# Patient Record
Sex: Male | Born: 1955 | Race: White | Hispanic: No | Marital: Single | Smoking: Never smoker
Health system: Southern US, Academic
[De-identification: ages and names within clinical notes are randomized; demographics above are authoritative.]

## PROBLEM LIST (undated history)

## (undated) DIAGNOSIS — E119 Type 2 diabetes mellitus without complications: Secondary | ICD-10-CM

## (undated) DIAGNOSIS — I10 Essential (primary) hypertension: Secondary | ICD-10-CM

## (undated) HISTORY — PX: FINGER AMPUTATION: SHX636

## (undated) HISTORY — PX: HX HERNIA REPAIR: SHX51

## (undated) HISTORY — DX: Essential (primary) hypertension: I10

## (undated) HISTORY — DX: Type 2 diabetes mellitus without complications (CMS HCC): E11.9

## (undated) HISTORY — PX: HX TONSILLECTOMY: SHX27

## (undated) HISTORY — PX: KNEE SURGERY: SHX244

---

## 2020-05-07 IMAGING — MR MRI THORACIC SPINE WITHOUT CONTRAST
4 of 6 series · 19 of 48 positions shown · IV contrast (gadolinium)
Comparison: Radiographs from outside facility dated 04/16/2020.

EXAM:  MRI THORACIC SPINE WITHOUT CONTRAST
INDICATION: Chronic mid upper back pain.
TECHNIQUE: Multiplanar multisequential MRI of the thoracic spine was performed without gadolinium contrast. A non-conventional body coil was utilized due to patient's extremely large body habitus.

[Series 10: T2 · sagittal · 5.0mm · 0.78mm/px · 8 of 11 slices shown (1 of 2)]
[im 1/11]
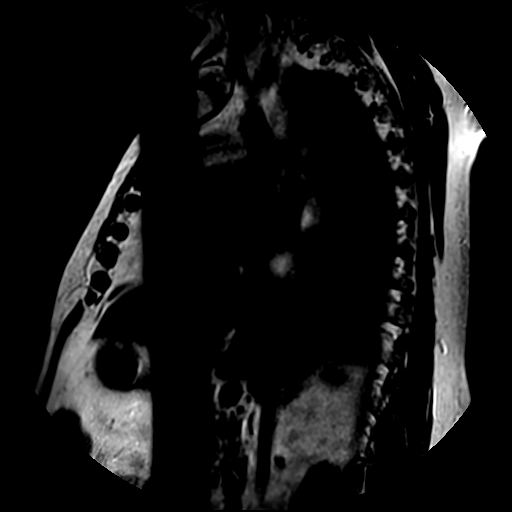
[im 2/11]
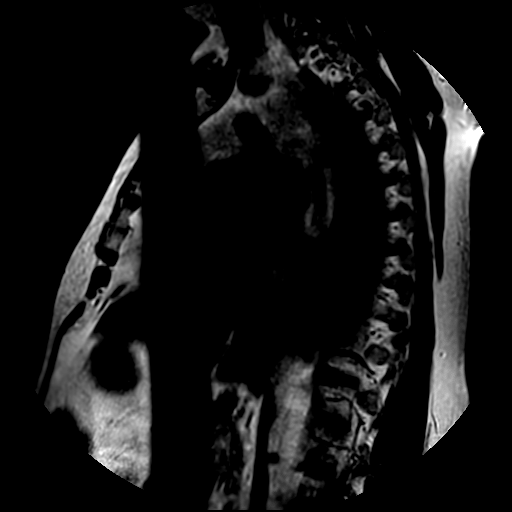
[im 3/11]
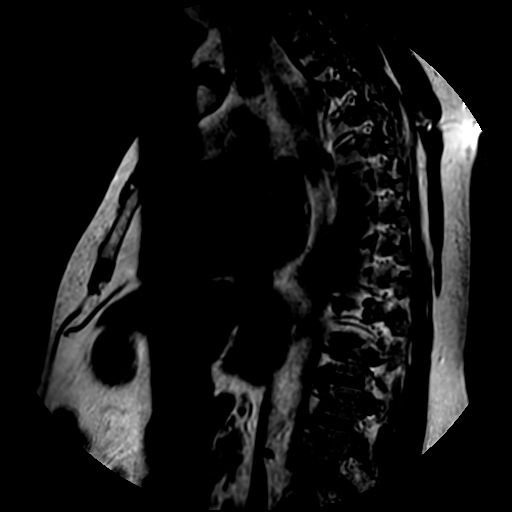
[im 5/11]
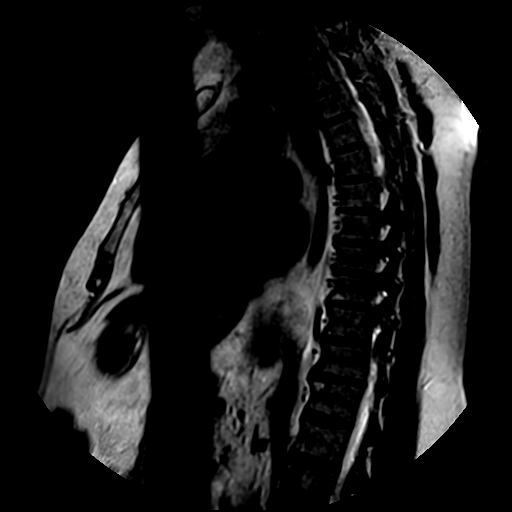
[im 6/11]
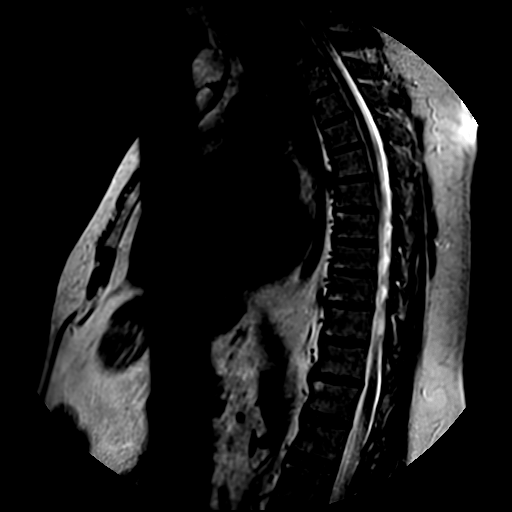
[im 8/11]
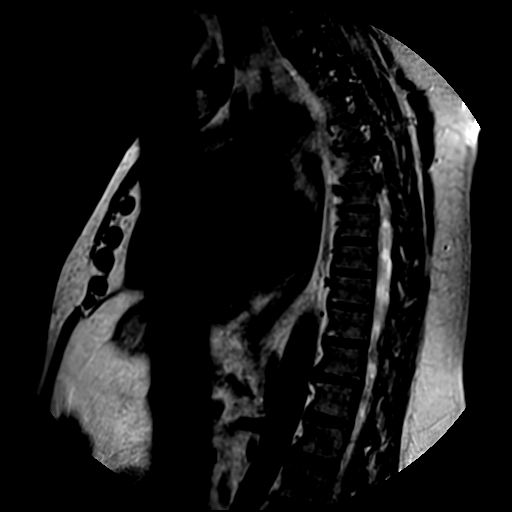
[im 9/11]
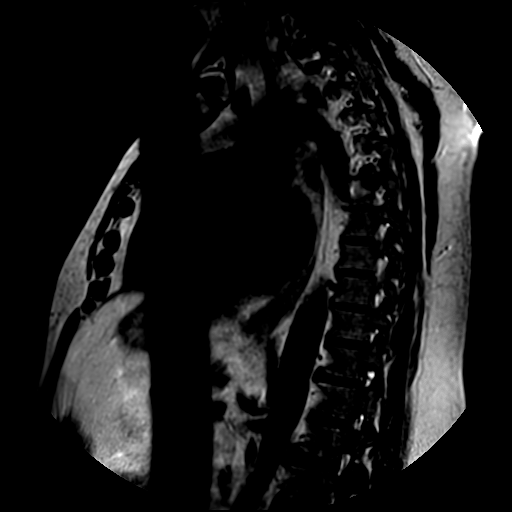
[im 11/11]
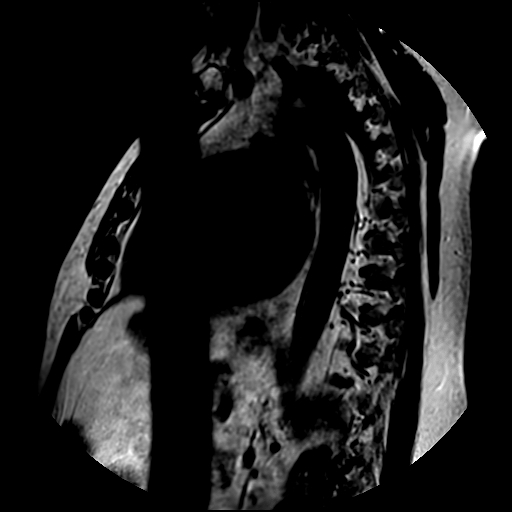

[Series 12: T1 · sagittal · 5.0mm · 0.78mm/px · 3 of 11 slices shown (1 of 2)]
[im 2/11]
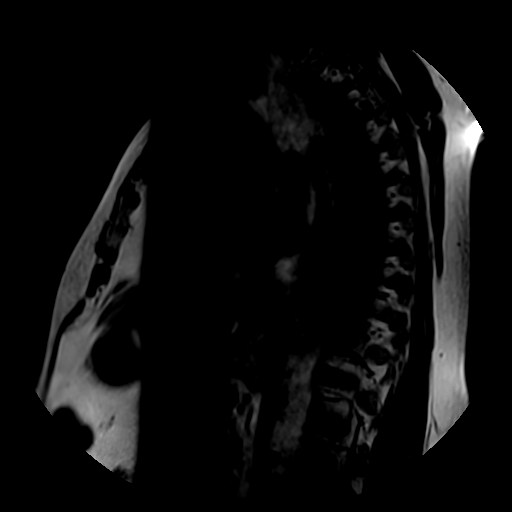
[im 6/11]
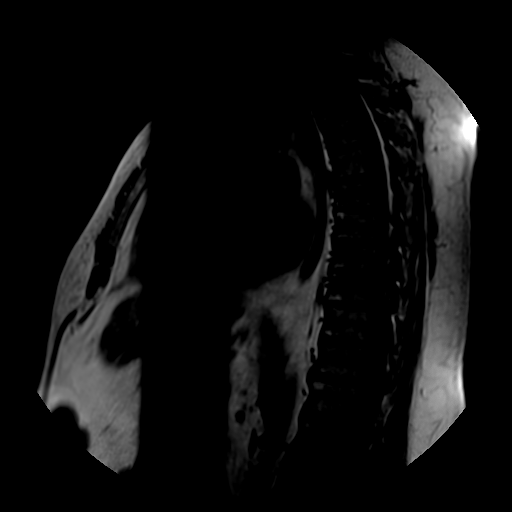
[im 9/11]
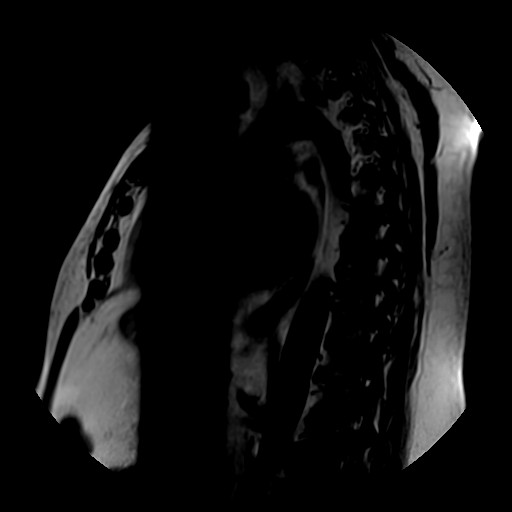

[Series 14: T2 · axial · 5.0mm · 0.45mm/px · z∈[-107,+76]mm · 5 of 12 slices shown (2 of 2)]
[im 1/12]
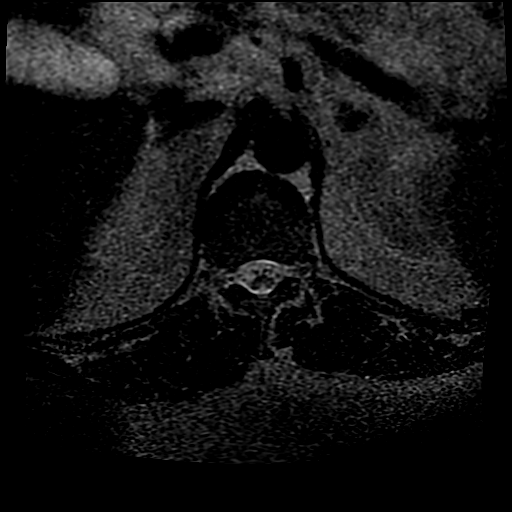
[im 2/12]
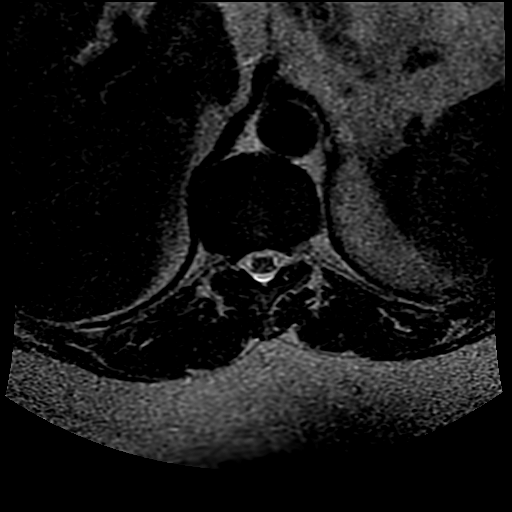
[im 3/12]
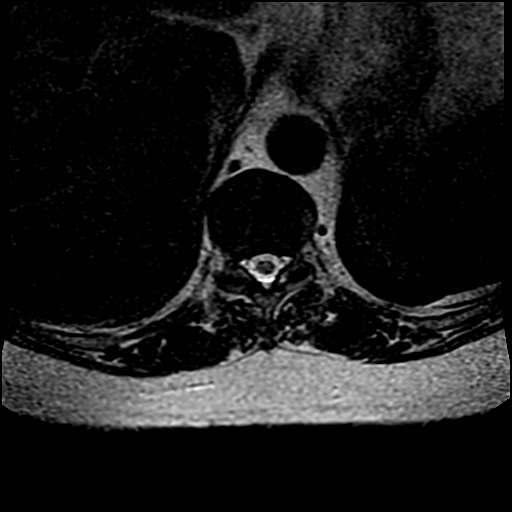
[im 6/12]
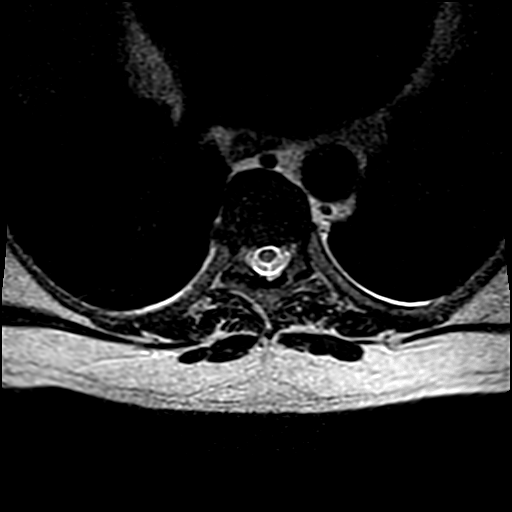
[im 10/12]
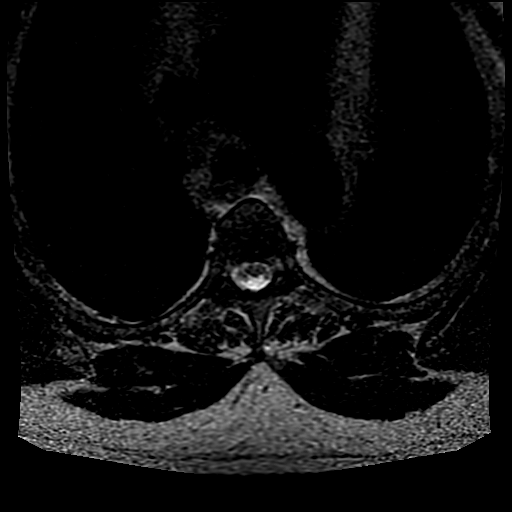

[Series 15: T1 · axial · 5.0mm · 0.45mm/px · z∈[-81,+76]mm · 3 of 12 slices shown (2 of 2)]
[im 2/12]
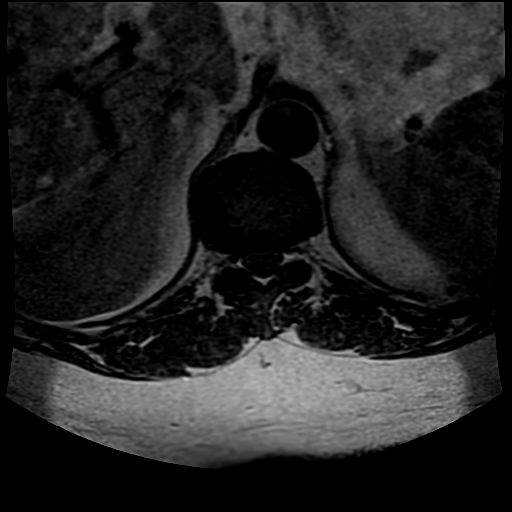
[im 6/12]
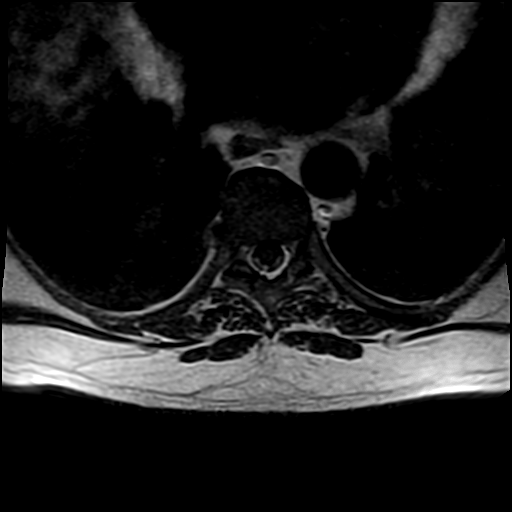
[im 10/12]
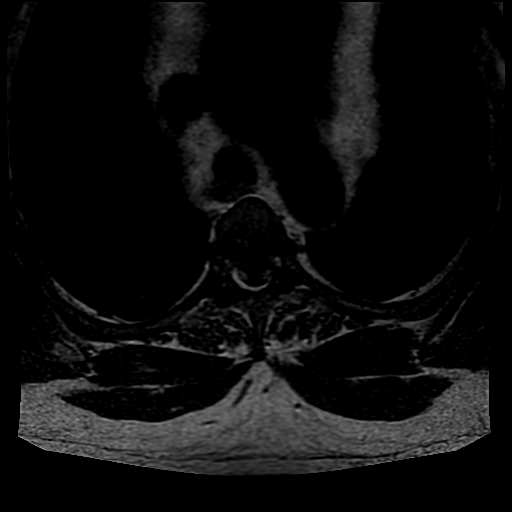

[19 of 48 positions shown; findings below may reference images not displayed]

FINDINGS: Vertebral bodies are normal in height, alignment and signal intensity. There is no acute fracture or subluxation. Visualized spinal cord is also normal in signal intensity without evidence of compression at any level.

There is no significant disc herniation, spinal canal or neural foraminal stenosis at any level.

Paraspinal soft tissues are also unremarkable. There is no pleural effusion.
IMPRESSION: Essentially unremarkable exam.

## 2020-05-07 IMAGING — MR MRI CERVICAL SPINE WITHOUT CONTRAST
4 of 5 series · 23 of 48 positions shown · IV contrast (gadolinium)
Comparison: Radiographs from outside facility dated 04/16/2020.

EXAM:  MRI CERVICAL SPINE WITHOUT CONTRAST
INDICATION: Chronic neck pain.
TECHNIQUE: Multiplanar multisequential MRI of the cervical spine was performed without gadolinium contrast.

[Series 8: T2 · sagittal · 3.0mm · 0.75mm/px · 8 of 13 slices shown (1 of 2)]
[im 1/13]
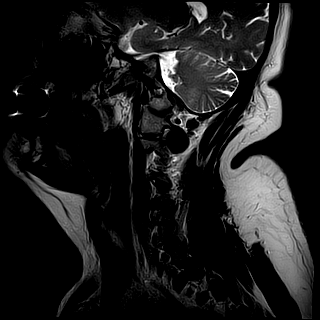
[im 2/13]
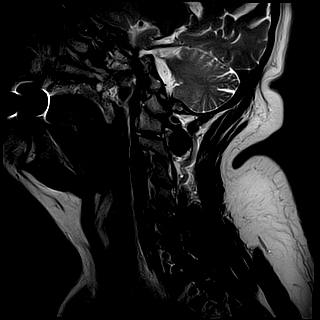
[im 4/13]
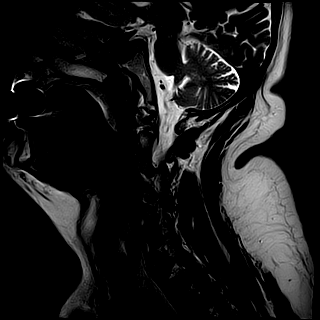
[im 6/13]
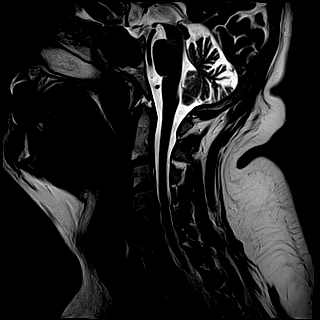
[im 7/13]
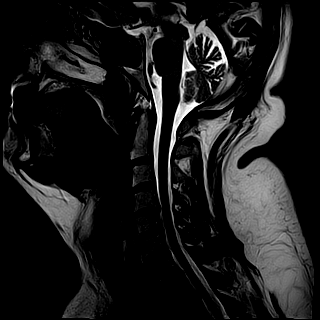
[im 9/13]
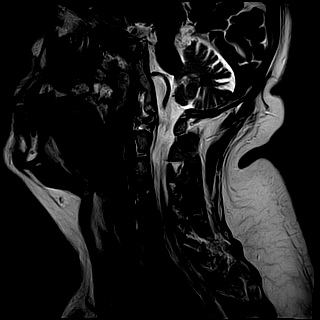
[im 11/13]
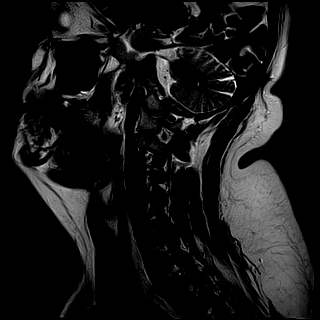
[im 13/13]
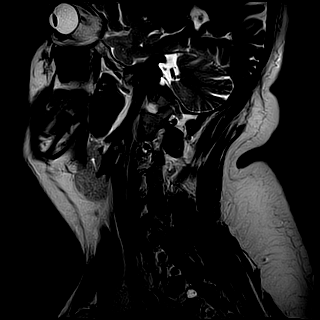

[Series 9: T1 · sagittal · 3.0mm · 0.47mm/px · 3 of 13 slices shown]
[im 2/13]
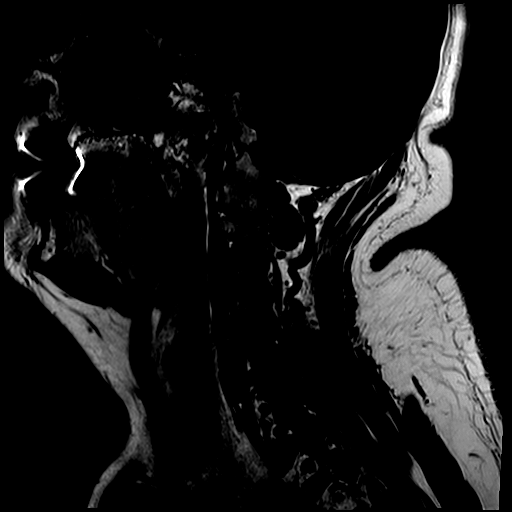
[im 7/13]
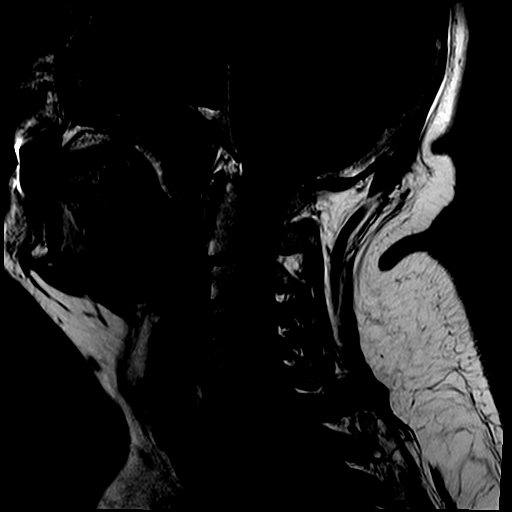
[im 11/13]
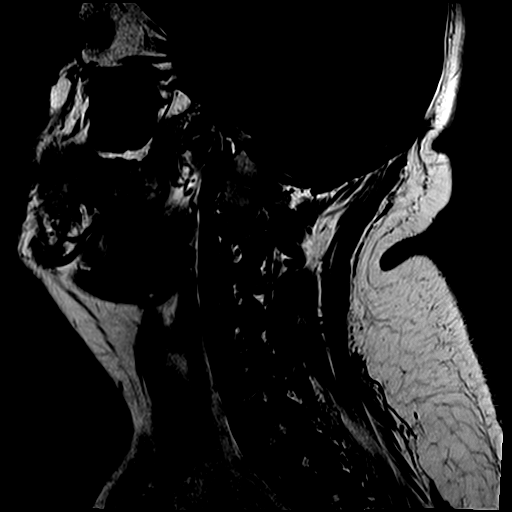

[Series 11: STIR · sagittal · 3.0mm · 0.47mm/px · 3 of 13 slices shown]
[im 2/13]
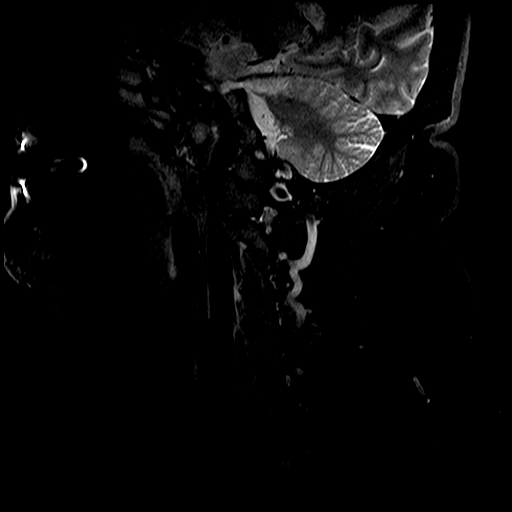
[im 7/13]
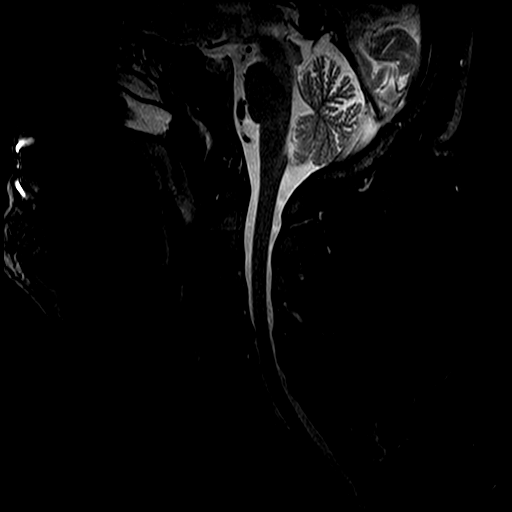
[im 11/13]
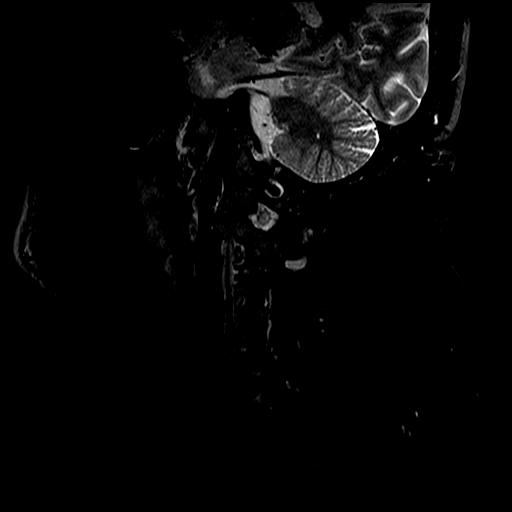

[Series 14: T2 · axial · 3.0mm · 0.31mm/px · z∈[-127,-7]mm · 9 of 18 slices shown (2 of 2)]
[im 1/18]
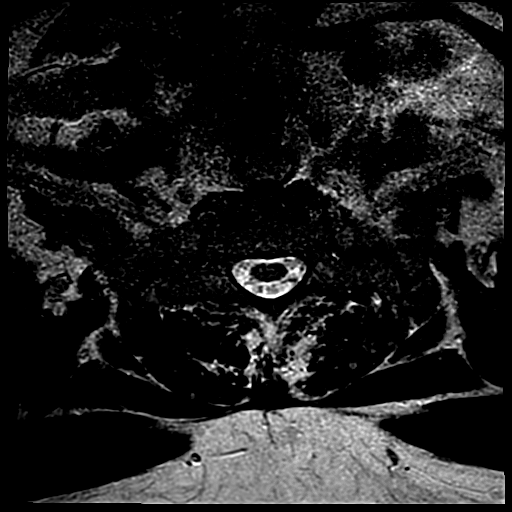
[im 4/18]
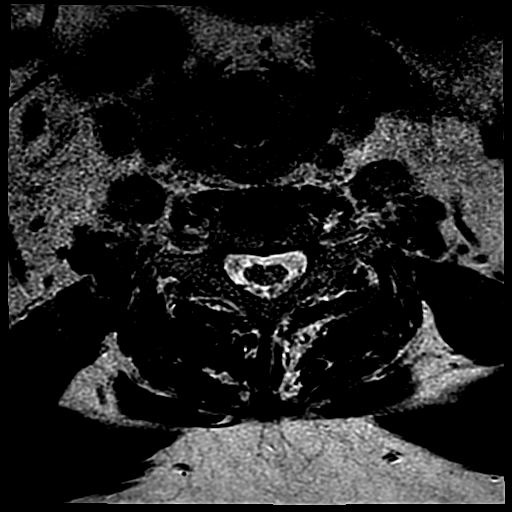
[im 5/18]
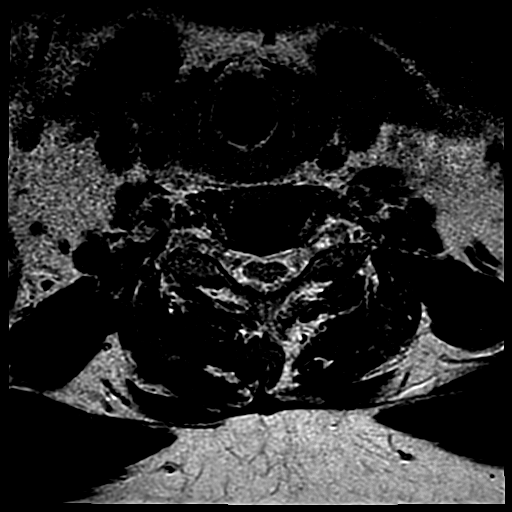
[im 8/18]
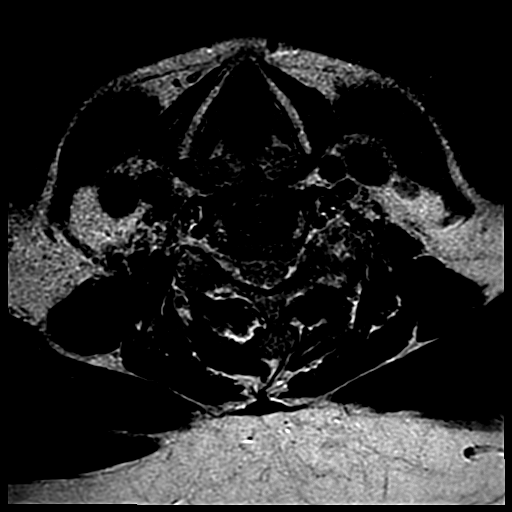
[im 10/18]
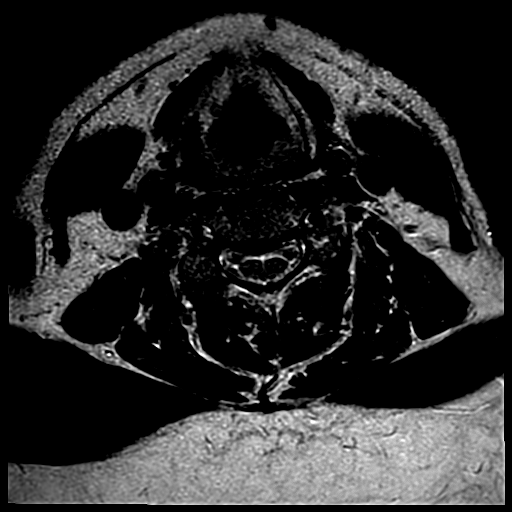
[im 13/18]
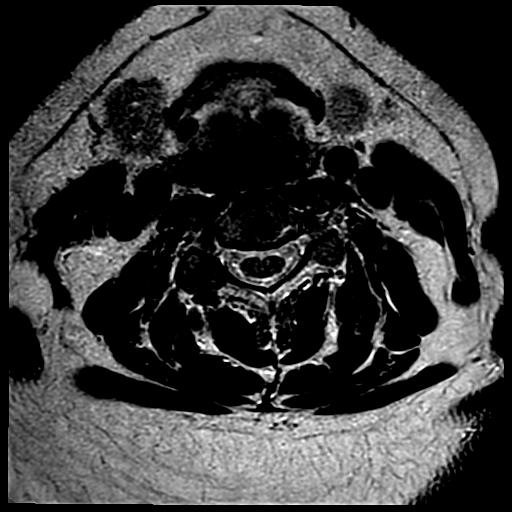
[im 14/18]
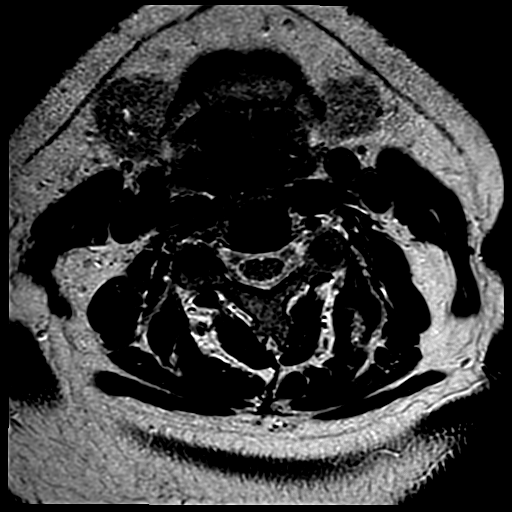
[im 16/18]
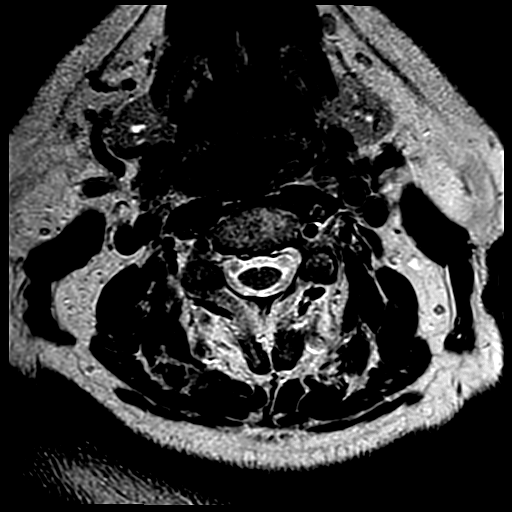
[im 18/18]
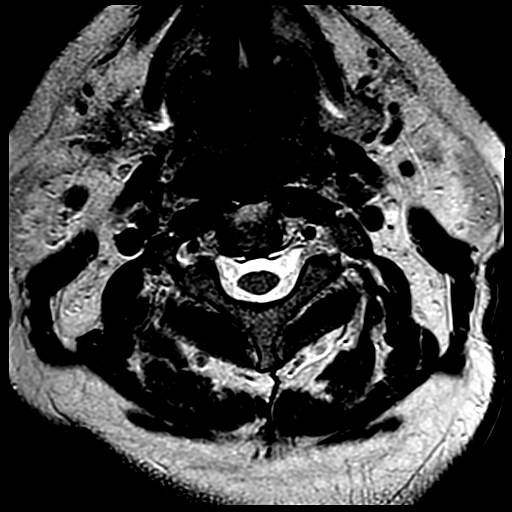

[23 of 48 positions shown; findings below may reference images not displayed]

FINDINGS: Vertebral bodies are normal in height, alignment and signal intensity. There is no acute fracture or subluxation. Visualized spinal cord is normal in signal intensity without evidence of compression at any level.

C2-3 and C3-4 levels are unremarkable.

At C4-5 level, there is a small broad-based central disc osteophyte complex with near complete effacement of the ventral CSF. There is no significant neural foraminal stenosis.

At C5-6 level, there is a minimal bulging annulus, minimally effacing the ventral CSF. There is moderate to severe bilateral neural foraminal stenosis from facet and uncovertebral joint hypertrophy.

At C6-7 level, there is a minimal bulging annulus, minimally effacing the ventral CSF. There is no significant neural foraminal stenosis.

At C7-T1 level, there is a small broad-based central disc bulge, mildly effacing the ventral CSF. There is no significant neural foraminal stenosis.

Paraspinal soft tissues are unremarkable.
IMPRESSION: Near complete effacement of the ventral CSF at C4-5 level from a small central disc osteophyte complex. 

Moderate to severe bilateral neural foraminal stenosis at C5-6 level from facet and uncovertebral joint hypertrophy.

## 2020-05-31 ENCOUNTER — Ambulatory Visit: Payer: 59 | Attending: Physician Assistant | Admitting: Neurological Surgery

## 2020-05-31 ENCOUNTER — Other Ambulatory Visit: Payer: Self-pay

## 2020-05-31 ENCOUNTER — Encounter (INDEPENDENT_AMBULATORY_CARE_PROVIDER_SITE_OTHER): Payer: Self-pay | Admitting: Neurological Surgery

## 2020-05-31 VITALS — BP 148/116 | HR 92 | Temp 96.7°F | Ht 71.0 in | Wt 290.3 lb

## 2020-05-31 DIAGNOSIS — G5600 Carpal tunnel syndrome, unspecified upper limb: Secondary | ICD-10-CM

## 2020-05-31 DIAGNOSIS — M79603 Pain in arm, unspecified: Secondary | ICD-10-CM | POA: Insufficient documentation

## 2020-05-31 DIAGNOSIS — M4800 Spinal stenosis, site unspecified: Secondary | ICD-10-CM | POA: Insufficient documentation

## 2020-05-31 DIAGNOSIS — M4802 Spinal stenosis, cervical region: Secondary | ICD-10-CM

## 2020-05-31 NOTE — Progress Notes (Addendum)
NEUROSURGERY, PHYSICIAN OFFICE CENTER  [address]  [place] [address]  Operated by [facility]  History and Physical     Name: Andrew Haney MRN:  [id number]   Date: 05/31/2020 Age: 64 y.o.       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 His pain is in the right posterior arm into the anterior forearm and 4th and 5th digits  He has pain in the bilateral shoulders. He has numbness and weakness in the left arm but the right arm has been more problematic. He denies balance issues or bb control.  His pain increases with lying supine, sitting and leaning back in a chair, or when his arms are above his head. His pain improves when up and active.  He denies PT. He was seen by chiropractor for hip only. He denies spine surgery, spine fractures or injections.  He takes 4 aleve a day with minimal help. Biofreeze doesn't help.  Past History  Current Outpatient Medications   Medication Sig    Amlodipine-Valsartan 10-320 mg Oral Tablet Take 1 Tablet by mouth Once a day     gemfibroziL (LOPID) 600 mg Oral Tablet Take 600 mg by mouth Twice a day before meals    hydroCHLOROthiazide (HYDRODIURIL) 25 mg Oral Tablet Take 25 mg by mouth Once a day    ibuprofen (MOTRIN) 400 mg Oral Tablet Take 400 mg by mouth Four times a day as needed for Pain    MetFORMIN (GLUCOPHAGE) 1,000 mg Oral Tablet Take 1,000 mg by mouth Twice daily with food    omega-3s-dha-epa-fish oil-D3 360 mg-1,200 mg -1,000 unit Oral Capsule Take by mouth    OZEMPIC 0.25 mg or 0.5 mg(2 mg/1.5 mL)  Subcutaneous Pen Injector 0.25 mg by Subcutaneous route Every 7 days    tamsulosin (FLOMAX) 0.4 mg Oral Capsule Take 0.4 mg by mouth Every evening after dinner     No Known Allergies  Past Medical History:   Diagnosis Date    Diabetes mellitus, type 2 (CMS HCC)     HTN (hypertension)        Past Surgical History:   Procedure Laterality Date    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    HX HERNIA REPAIR      HX TONSILLECTOMY      KNEE SURGERY           Family History  Family Medical History:       Problem Relation (Age of Onset)    Cerebral Aneurysm Father, Paternal Uncle    Healthy Mother              Social History  Social History     Socioeconomic History    Marital status: Single     Spouse name: Not on file    Number of children: 2    Years of education: Not  on file    Highest education level: Not on file   Tobacco Use    Smoking status: Never Smoker    Smokeless tobacco: Never Used    Tobacco comment: quit 26 yrs ago   Substance and Sexual Activity    Alcohol use: Not Currently    Drug use: Never     Social Determinants of Health     Financial Resource Strain:     Difficulty of Paying Living Expenses:    Food Insecurity:     Worried About Programme researcher, broadcasting/film/video in the Last Year:     Barista in the Last Year:    Transportation Needs:     Freight forwarder (Medical):     Lack of Transportation (Non-Medical):    Physical Activity:     Days of Exercise per Week:     Minutes of Exercise per Session:    Stress:     Feeling of Stress :    Intimate Partner Violence:     Fear of Current or Ex-Partner:     Emotionally Abused:     Physically Abused:     Sexually Abused:      Review of Systems  Other than ROS in the HPI, all other systems were negative.    Examination  BP (!) 148/116    Pulse 92    Temp 35.9 C (96.7 F) (Thermal Scan)    Ht 1.803 m (5\' 11" )    Wt 132 kg (290 lb 5.5 oz)    SpO2 93%    BMI 40.49 kg/m         Constitutional  General appearance: Normal  HNNT: Normal  Eyes: Ophthalmic  exam of optic discs and posterior segments: Normal  Cardiovascular:   Carotid arteries: Normal  Auscultation: Normal  Peripheral vascular system: Normal  Musculoskeletal  Gait and Station: : Normal  Muscle strength (upper extremities): : Abnormal: grip 3+/5 bilat  Muscle strength (lower extremities): : Normal  Muscle tone (upper extremities): : Normal  Muscle tone (lower extremities): : Normal  Sensation: Abnormal: decreased right 3rd,4th and 5th digits  Deep tendon reflexes upper and lower extremities: Normal  Coordination: Normal  Hoffman's reflex: Left: negative Right: negative  Ankle clonus:Left : Not present Right Not present    Neurological  Orientation: Normal  Recent and remote memory: Normal  Attention span and concentration: Normal  Language: Normal  Fund of knowledge: Normal  Cranial Nerves  2nd: Normal  3rd,4th,6th: Normal  5th: Normal  7th: Normal  8th: Normal  9th: Normal  11th: Normal  12th: Normal      Data reviewed  1. MRI of the Cervical Spine performed on 05/07/20 on [facility] PACS and it shows mild deg changes with stenosis C4-C6.   EMG 03/12/20 [facility] - shows right C8-T1 radic and mild right CTS.  Discussions with other providers:     Diagnosis  1. Carpal tunnel syndrome, unspecified laterality    2. Central stenosis of spinal canal    3. Arm pain        Recommendations  Orders Placed This Encounter    Refer to [facility] Neurosurgery,POC    DME WRIST SPLINT(REQUISITION/REQ)   Recommend a trial of right wrist splint to be worn at night (rx provided).  Will arrange an eval by Dr. [facility] for cts since Dr. Cherrie Distance felt his symptoms were more consistent with cts.  The patient was seen as a shared visit with the co-signing faculty.  Melvyn Neth  [place], PA-C    Late entry for 05/31/20. I personally saw and evaluated the patient. See mid-level's note for additional details. My findings/participation are attempt medical management for CTS.     Lindie Spruce, MD

## 2020-06-24 ENCOUNTER — Ambulatory Visit (INDEPENDENT_AMBULATORY_CARE_PROVIDER_SITE_OTHER): Payer: Self-pay | Admitting: Neurological Surgery

## 2020-06-24 NOTE — Telephone Encounter (Signed)
I faxed over office notes from 05-31-20 as requested. [email or web address] 06-24-20

## 2020-06-24 NOTE — Telephone Encounter (Signed)
Regarding: Lewis  ----- Message from Sherril Cong sent at 06/24/2020  9:45 AM EDT -----  Please send office note from 05-31-20 to referring office, fax # [phone]

## 2020-07-19 ENCOUNTER — Encounter (INDEPENDENT_AMBULATORY_CARE_PROVIDER_SITE_OTHER): Payer: Self-pay | Admitting: Neurological Surgery

## 2020-07-19 ENCOUNTER — Ambulatory Visit: Payer: 59 | Attending: Physician Assistant | Admitting: Neurological Surgery

## 2020-07-19 ENCOUNTER — Other Ambulatory Visit: Payer: Self-pay

## 2020-07-19 DIAGNOSIS — G5601 Carpal tunnel syndrome, right upper limb: Secondary | ICD-10-CM

## 2020-07-19 DIAGNOSIS — M4800 Spinal stenosis, site unspecified: Secondary | ICD-10-CM | POA: Insufficient documentation

## 2020-07-19 DIAGNOSIS — G5600 Carpal tunnel syndrome, unspecified upper limb: Secondary | ICD-10-CM | POA: Insufficient documentation

## 2020-07-19 DIAGNOSIS — M79603 Pain in arm, unspecified: Secondary | ICD-10-CM | POA: Insufficient documentation

## 2020-07-19 NOTE — Progress Notes (Signed)
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 male who is here for a new patient visit referred by Dr. Melvyn Haney to establish care and initiate work up if indicated for right CTS. PMH significant for DM, HTN. No tobacco use.    Per note; right arm pain that started in 01/18/20 when trying to pull up on a banister. His pain is in the right posterior arm into the anterior forearm and 4th and 5th digits  He has pain in the bilateral shoulders. He has numbness and weakness in the left arm but the right arm has been more problematic. His pain increases with lying supine, sitting and leaning back in a chair, or when his arms are above his head. His pain improves when up and active. Right wrist splint ordered to be worn at night.     Today's visit:  Today the patient is here for new patient evaluation for right CTS. The patient presents to the clinic today accompanied by his significant other who adds to his history. The patient reports neck pain 7/10 today that radiates to bilateral shoulders, arms, hand R>L, that is aggravated by lying down supine, arm movement, turning head side to side and relieved by nothing. He also reports constant numbness/tingling to 3rd, 4th, and 5th digits. Weakness to bilateral arms and hands R>L, difficulty opening things, difficulty holding a cup of coffee, difficulty with grip and dexterity.  He reports his symptoms are worse since he was seen by Dr. Melvyn Haney. He also reports a history of tremors to right hand. He did not receive hand  splint after his last visit, reports he has to sleep in chair, unable to sleep in bed due to increased pain. The patient denies bowel or bladder dysfunction, recent falls or injuries at this time.     Past History    Current Outpatient Medications:     Amlodipine-Valsartan 10-320 mg Oral Tablet, Take 1 Tablet by mouth Once a day , Disp: , Rfl:     gemfibroziL (LOPID) 600 mg Oral Tablet, Take 600 mg by mouth Twice a day before meals, Disp: , Rfl:     hydroCHLOROthiazide (HYDRODIURIL) 25 mg Oral Tablet, Take 25 mg by mouth Once a day, Disp: , Rfl:     ibuprofen (MOTRIN) 400 mg Oral Tablet, Take 400 mg by mouth Four times a day as needed for Pain, Disp: , Rfl:     MetFORMIN (GLUCOPHAGE) 1,000 mg Oral Tablet, Take 1,000 mg by mouth Twice daily with food, Disp: , Rfl:     omega-3s-dha-epa-fish oil-D3 360 mg-1,200 mg -1,000 unit Oral Capsule, Take by mouth, Disp: , Rfl:     OZEMPIC 0.25 mg or 0.5 mg(2 mg/1.5 mL) Subcutaneous Pen Injector, 0.25 mg by Subcutaneous route Every 7 days, Disp: , Rfl:     tamsulosin (FLOMAX) 0.4 mg Oral Capsule, Take 0.4 mg by mouth Every evening after dinner, Disp: , Rfl:   No Known Allergies  Past  Medical History:   Diagnosis Date    Diabetes mellitus, type 2 (CMS HCC)     HTN (hypertension)          Past Surgical History:   Procedure Laterality Date    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    HX HERNIA REPAIR      HX TONSILLECTOMY      KNEE SURGERY         Family History  Family Medical History:     Problem Relation (Age of Onset)    Cerebral Aneurysm Father, Paternal Uncle    Healthy Mother              Social History  Social History     Socioeconomic History    Marital status: Single     Spouse name: Not on file    Number of children: 2    Years of education: Not on file    Highest education level: Not on file   Tobacco Use    Smoking status: Never Smoker    Smokeless tobacco: Never Used    Tobacco comment: quit 26 yrs ago   Substance and Sexual Activity     Alcohol use: Not Currently    Drug use: Never     Social Determinants of Health     Financial Resource Strain:     Difficulty of Paying Living Expenses:    Food Insecurity:     Worried About Programme researcher, broadcasting/film/video in the Last Year:     Barista in the Last Year:    Transportation Needs:     Freight forwarder (Medical):     Lack of Transportation (Non-Medical):    Physical Activity:     Days of Exercise per Week:     Minutes of Exercise per Session:    Stress:     Feeling of Stress :    Intimate Partner Violence:     Fear of Current or Ex-Partner:     Emotionally Abused:     Physically Abused:     Sexually Abused:        Review of Systems  Other than ROS in the HPI, all other systems were negative.      Examination  BP (!) 139/92    Pulse (!) 115    Temp 36 C (96.8 F)    Ht 1.803 m (5\' 11" )    Wt 132 kg (292 lb 1.8 oz)    BMI 40.74 kg/m       Constitutional:Well groomed, in no apparent distress  HEENT: Normal  Cardiovascular:    Auscultation: Regular rate and rhythm  Respiratory: CTA bilaterally, Unlabored  Gastrointestinal: Not done  Hem/Lymph:  Deferred  Musculoskeletal  Gait and Station: slow and steady with no ataxia  Strength:  Arm Right Left Leg Right Left   Deltoid (shoulder abduction) 5/5 5/5 Hip Flexion 5/5 5/5   Biceps (Elbow flexion) 5/5 5/5 Knee Flexion 5/5 5/5   Triceps (Elbow extension) 5/5 5/5 Knee Extension 5/5 5/5   Grip 3/5 4/5 Foot Dorsiflexion 5/5 5/5      Foot Plantarflexion 5/5 5/5   Muscle tone (upper extremities): WNL  Muscle tone (lower extremities): WNL  Sensory: Sensory exam in the upper and lower extremities is normal except as noted: Decreased right 3rd, 4th, 5th digits   DTR's upper and lower extremities: Normal  Hoffman's reflex: Negative bilaterally   Ankle clonus: Negative  Musculoskeletal tenderness: Positive  neck   Neurological  Level of consciousness: Alert and oriented  Recent and remote memory: Good recall and able to follow commands  Attention span  and concentration: Normal in conversation  Language/Speech: No aphasia or dysarthria  Fund of knowledge: Appropriate in this setting  Cranial Nerves  2nd: PERRL  3rd,4th,6th:  EOMI, no nystagmus  5th: Facial sensation intact  8th: Hearing grossly intact  11th: Normal shoulder shrug  Unable to assess remainder of cranial nerves as patient wearing mask for infection prevention purposes      Data reviewed  - Outside records/ Previous charts reviewed  MRI Cervical Spine done 05/07/20    Discussions with other providers: Patient history and presentation were discussed with Dr. Cherrie Distance     Assessment:    ICD-10-CM    1. Carpal tunnel syndrome, unspecified laterality  G56.00 Refer to [facility] Neurosurgery,POC     PHYSICAL THERAPY OUTSIDE ORDER (NS SPECIFIC)   2. Central stenosis of spinal canal  M48.00 Refer to [facility] Neurosurgery,POC     PHYSICAL THERAPY OUTSIDE ORDER (NS SPECIFIC)   3. Arm pain  M79.603 Refer to [facility] Neurosurgery,POC     PHYSICAL THERAPY OUTSIDE ORDER (NS SPECIFIC)       Treatment Plan  -  Andrew Haney is in agreement with the following plan:    - The natural history, film findings, and indications for treatment were discussed.   - The patient is doing well clinically and his films remain stable per Dr. Cherrie Distance   - A physical therapy prescription was provided to the patient today to obtain wrist splints   - Will obtain outside EMG report  - RTC in 6 weeks or sooner if problems develop.   - Continue Medical Management (Diet, Exercise, Medication).      - The patient has been advised to follow up with their PCP in regards any chronic medical conditions and any non-neurosurgical symptoms that they may have. A copy of the note from today's clinic appointment will be sent to the patient's PCP Dominga Ferry, NP on file (confirmed during visit)      The patient was seen as a shared visit with the co-signing faculty.    Corene Cornea, APRN 07/22/2020, 09:36    With Dr. Cherrie Distance   I personally saw and examined the  patient. See the mid-level provider's note for additional details. My findings are as follows:    History:  Onset right arm pain in Feb. Gradually worsening to involve neck, both shoulders and arms. Notes numbness and paresthesias medial right forearm, middle, ring and little fingers. History of chronic right shoulder pain treated with injections.    Exam:  Motor intact, sensory reduced right middle, ring, and little fingers. Reflexes normal.    Studies:  Cervical MRI shows mild spondylosis. Has had an EMG that reportedly shows CTS. Report not available today.    Imp:  Mild cervical spondylosis, no neural compression seen. Report of CTS on EMG, but symptoms not in median nerve distribution.    Plan:  Trial of PT and wrist splint, obtain outside EMG report. Return in 6 weeks.      Barnabas Lister, MD 07/22/2020, 09:36

## 2020-07-30 ENCOUNTER — Ambulatory Visit (INDEPENDENT_AMBULATORY_CARE_PROVIDER_SITE_OTHER): Payer: Self-pay | Admitting: Neurological Surgery

## 2020-07-30 NOTE — Telephone Encounter (Signed)
Regarding: Cherrie Distance  ----- Message from Georgina Peer sent at 07/30/2020  9:16 AM EDT -----  Dr Cherrie Distance, pt states he is in so much pain that he cannot do the phys therapy.  He is also not getting any sleep.  Offered to make another appt but she thought it was a waste as he had already been here twice.

## 2020-07-30 NOTE — Telephone Encounter (Signed)
Marylu Lund, please advise

## 2020-08-05 ENCOUNTER — Ambulatory Visit (INDEPENDENT_AMBULATORY_CARE_PROVIDER_SITE_OTHER): Payer: Self-pay | Admitting: Neurological Surgery

## 2020-08-05 NOTE — Telephone Encounter (Signed)
Alanda Slim [phone]   He is still trying to work  Can't sleep either  She will discuss with him

## 2020-08-05 NOTE — Telephone Encounter (Signed)
-----   Message from Corene Cornea, APRN sent at 08/04/2020  4:10 PM EDT -----  Regarding: RE: Cherrie Distance  ----- Message from Jerrel Ivory sent at 08/04/2020 12:49 PM EDT -----  Girlfriend is calling again about previous message.  Please call her.    ----- Message from Georgina Peer sent at 07/30/2020  9:16 AM EDT -----  Dr Cherrie Distance, pt states he is in so much pain that he cannot do the phys therapy.  He is also not getting any sleep.  Offered to make another appt but she thought it was a waste as he had already been here twice.

## 2020-08-05 NOTE — Telephone Encounter (Signed)
Corene Cornea, APRN   I reviewed with Cherrie Distance. Dr. Cherrie Distance is suggesting a referral to pain management for injection. I can put the order in for this if the patient wants it, can you please call him and let me know.   If he gets an injection before his next follow up with Dr. Cherrie Distance we can assess if he received relief or not.   Thanks   Marylu Lund

## 2020-08-05 NOTE — Nursing Note (Signed)
Called and informed about the pain management referral- spoke with patient and gf, they do not want to do that. He has done it prior, but only received temporary relief.    Any other options    Clovis Pu, RN  08/05/2020, 10:43

## 2020-08-25 ENCOUNTER — Ambulatory Visit: Payer: 59 | Attending: Neurological Surgery | Admitting: Neurological Surgery

## 2020-08-25 ENCOUNTER — Encounter (INDEPENDENT_AMBULATORY_CARE_PROVIDER_SITE_OTHER): Payer: Self-pay | Admitting: Neurological Surgery

## 2020-08-25 ENCOUNTER — Other Ambulatory Visit: Payer: Self-pay

## 2020-08-25 DIAGNOSIS — M542 Cervicalgia: Secondary | ICD-10-CM | POA: Insufficient documentation

## 2020-08-25 DIAGNOSIS — M47812 Spondylosis without myelopathy or radiculopathy, cervical region: Secondary | ICD-10-CM | POA: Insufficient documentation

## 2020-08-25 NOTE — Progress Notes (Signed)
NEUROSURGERY, PHYSICIAN OFFICE CENTER  [address]  [place] [address]  Operated by [facility]  Telephone Visit    Name:  Andrew Haney MRN: [id number]   Date:  08/25/2020 Age:   64 y.o.     The patient/family initiated a request for telephone service.  Verbal consent for this service was obtained from the patient/family.    Last office visit in this department: 07/19/2020     I personally offered the service to the patient, and obtained verbal consent to provide this service.    Phillips Climes, PA-C        I personally spoke with the patient. See the mid-level provider's note for additional details. My findings are as follows:    History:  Saw recently for neck pain, numbness medial arms and hands. Went to PT but released after only one visit. Now with swelling in hands and feet. Difficulty writing. Given diagnosis of rheumatoid arthritis.    Studies:  Cervical MRI had shown mild left C5-6 osteophyte, no neural compression. EMG report states active right C8-T1 radic, mild right CTS.    Imp:  Has neck pain due to spondylosis, but no evidence of radicular compression on MRI. 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 Suspect he may be symptomatic from an inflammatory arthropathy.    Plan:  Will try to arrange local pain clinic for cervical injections. He will contact his PCP about new referral to rheumatologist.      Barnabas Lister, MD 08/25/2020, 11:29        Total provider time spent with the patient on the phone: 18 minutes.

## 2020-08-30 ENCOUNTER — Ambulatory Visit (INDEPENDENT_AMBULATORY_CARE_PROVIDER_SITE_OTHER): Payer: 59 | Admitting: Neurological Surgery

## 2020-09-17 IMAGING — MR MRI JOINT UPPER EXTREMITY WITHOUT CONTRAST RT
8 series · 37 of 40 positions shown · IV contrast (gadolinium)
Comparison: None.

﻿EXAM:  MRI JOINT UPPER EXTREMITY WITHOUT CONTRAST RT SHOULDER
INDICATION: Chronic right shoulder pain.
TECHNIQUE: Multiplanar, multisequential MRI of the right shoulder joint was performed without gadolinium contrast.

[Series 4: axial shim · axial · right · 10.0mm · 3.12mm/px · z∈[-70,+50]mm · 3 of 13 slices shown (1 of 2)]
[im 1/13]
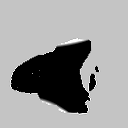
[im 7/13]
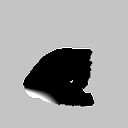
[im 13/13]
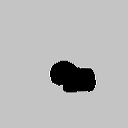

[Series 5: axial shim · axial · right · 10.0mm · 3.12mm/px · z∈[-70,+50]mm · 3 of 13 slices shown (2 of 2)]
[im 1/13]
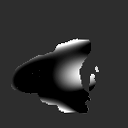
[im 7/13]
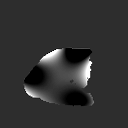
[im 13/13]
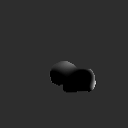

[Series 6: T1 · oblique · right · 4.0mm · 0.31mm/px · 5 of 22 slices shown]
[im 1/22]
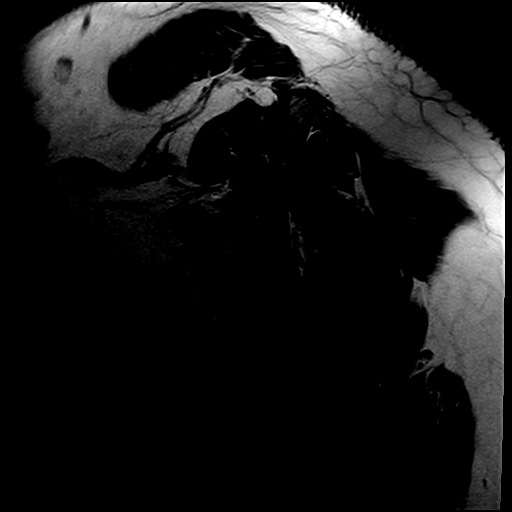
[im 6/22]
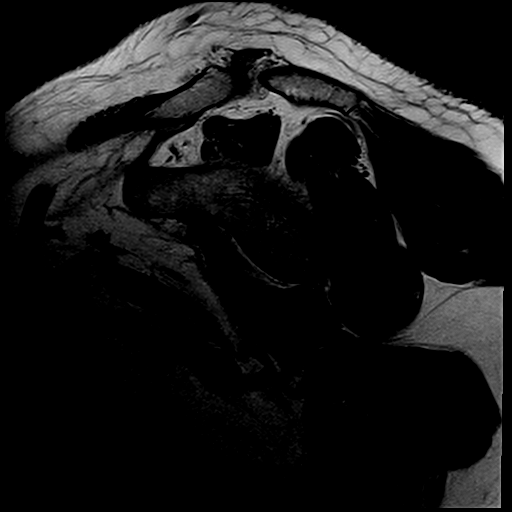
[im 11/22]
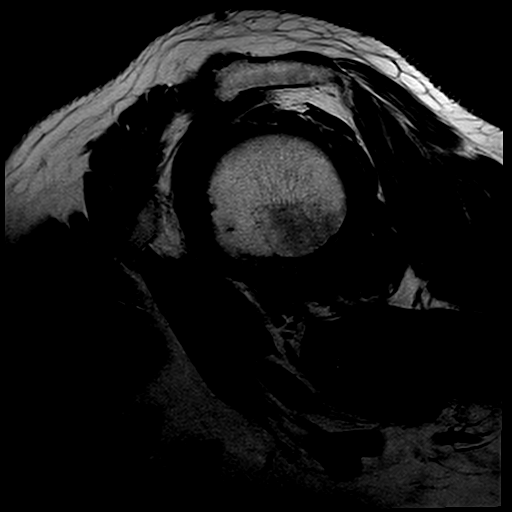
[im 16/22]
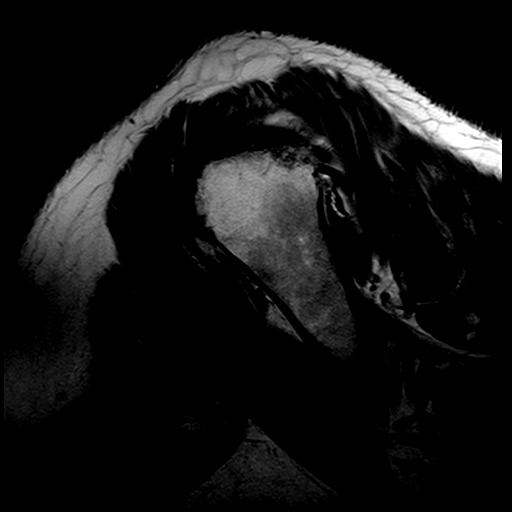
[im 22/22]
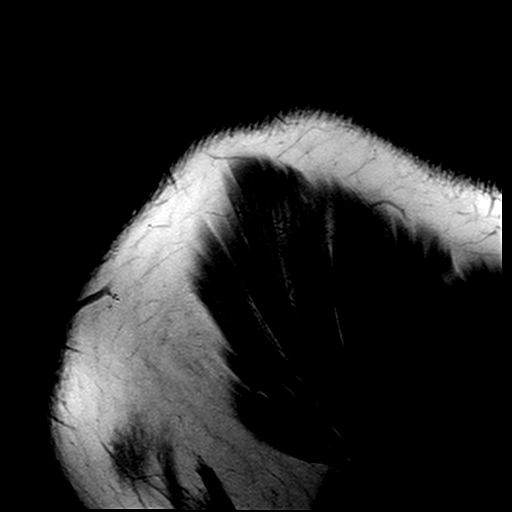

[Series 7: PD fat-sat · axial · right · 4.0mm · 0.36mm/px · z∈[-33,+71]mm · 6 of 24 slices shown (1 of 2)]
[im 1/24]
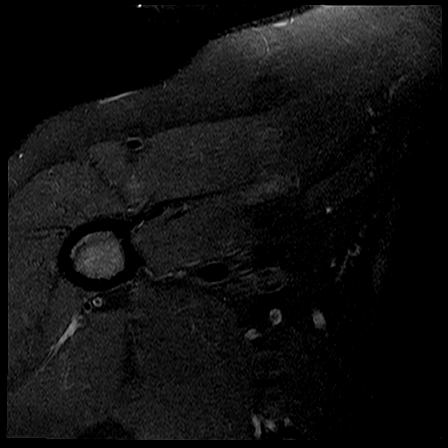
[im 5/24]
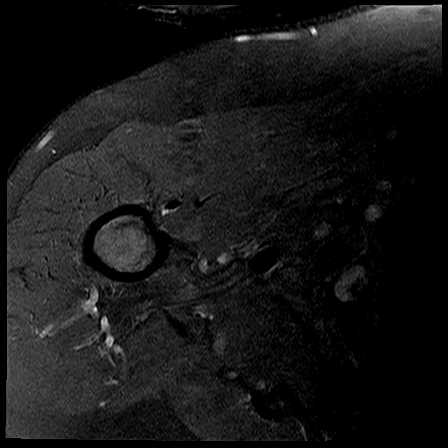
[im 10/24]
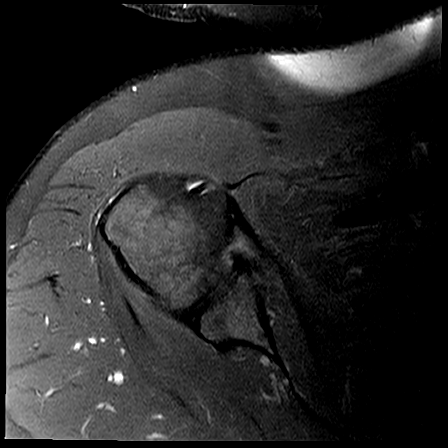
[im 14/24]
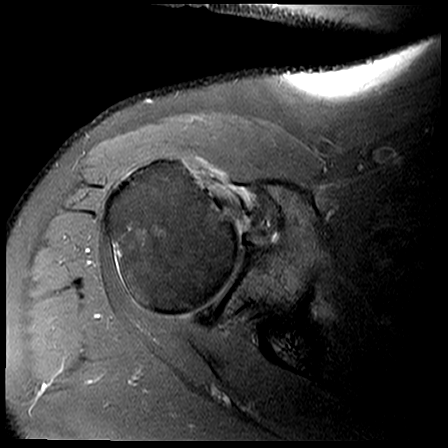
[im 19/24]
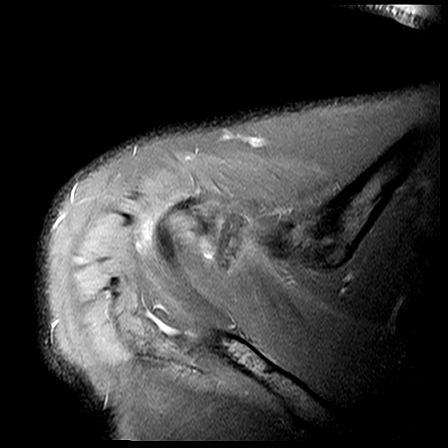
[im 24/24]
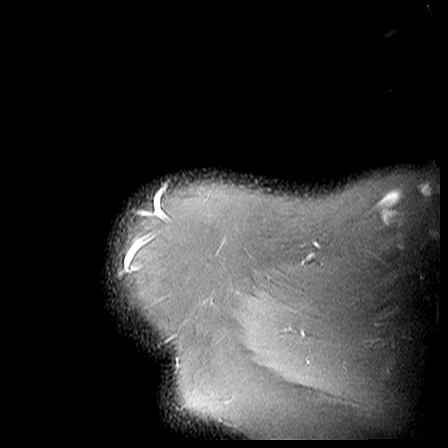

[Series 8: T2 fat-sat · axial · right · 4.0mm · 0.42mm/px · z∈[-33,+71]mm · 6 of 24 slices shown]
[im 1/24]
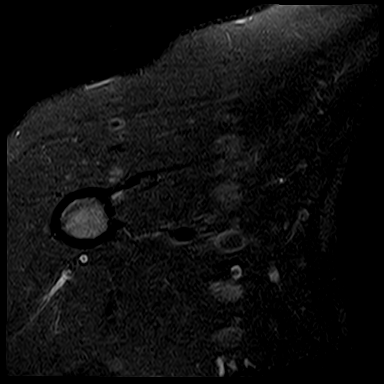
[im 5/24]
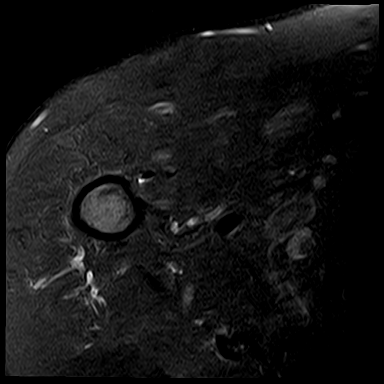
[im 10/24]
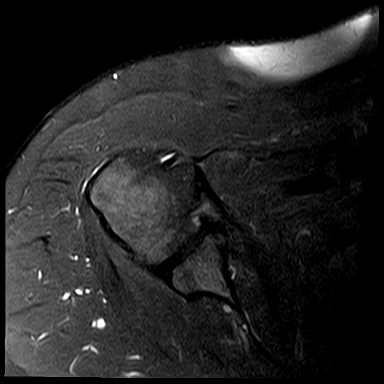
[im 14/24]
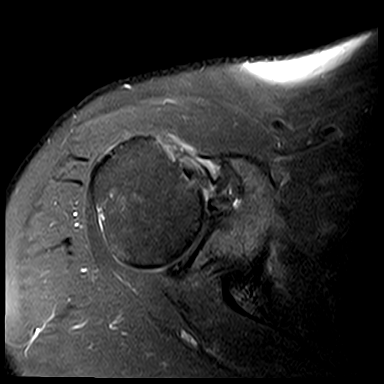
[im 19/24]
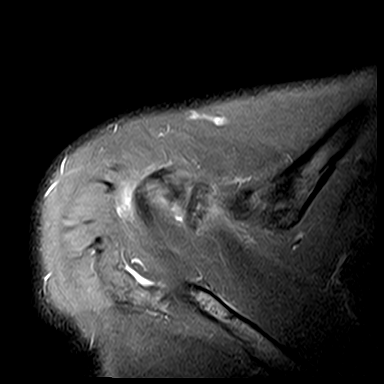
[im 24/24]
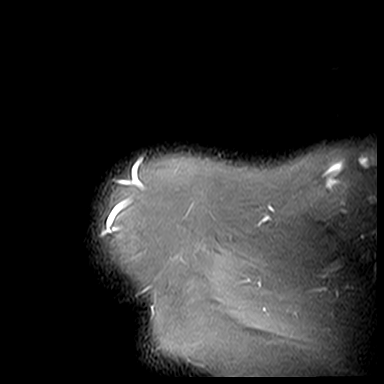

[Series 9: STIR · oblique · right · 4.0mm · 0.50mm/px · 5 of 22 slices shown (1 of 2)]
[im 1/22]
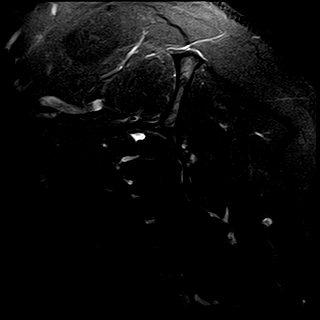
[im 6/22]
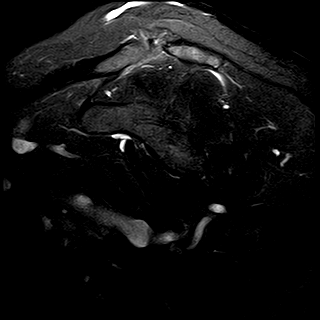
[im 11/22]
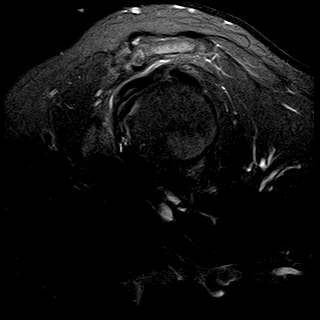
[im 16/22]
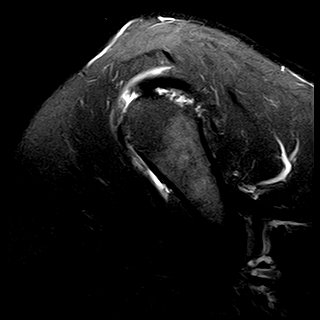
[im 22/22]
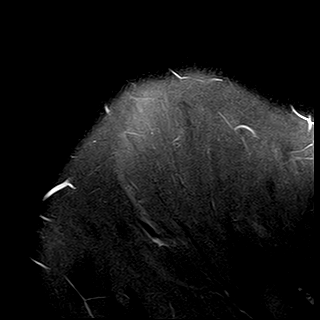

[Series 10: PD fat-sat · oblique · right · 4.0mm · 0.47mm/px · 6 of 23 slices shown (2 of 2)]
[im 1/23]
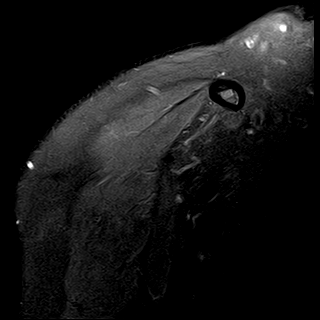
[im 5/23]
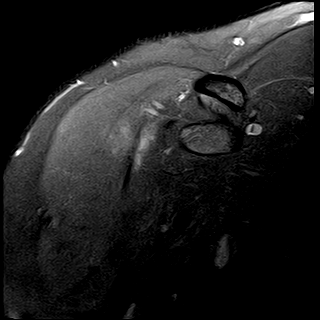
[im 9/23]
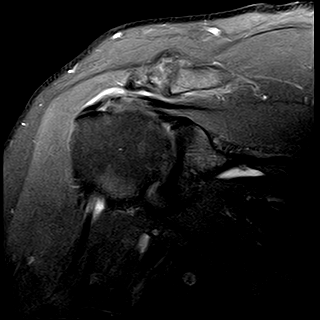
[im 14/23]
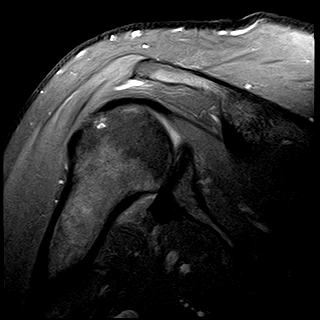
[im 18/23]
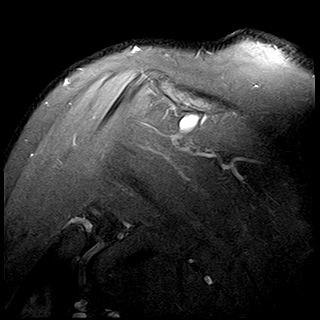
[im 23/23]
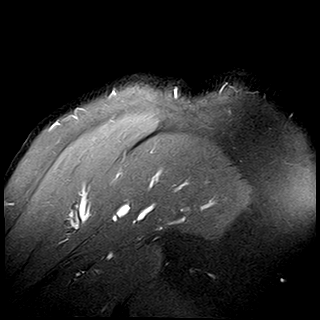

[Series 11: STIR · oblique · right · 4.0mm · 0.47mm/px · 3 of 23 slices shown (2 of 2)]
[im 1/23]
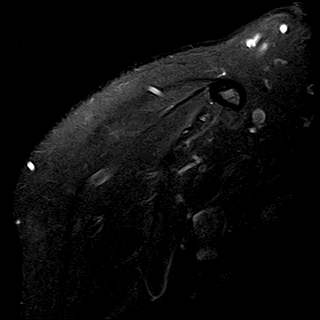
[im 5/23]
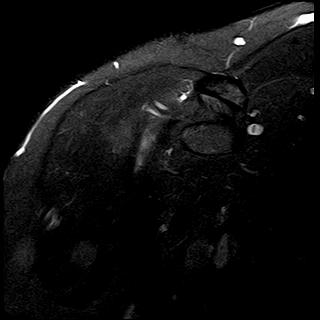
[im 9/23]
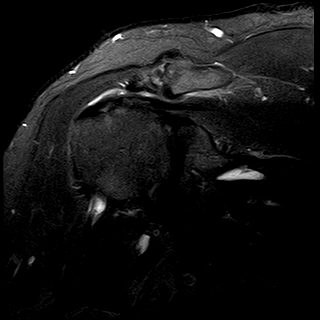

[37 of 40 positions shown; findings below may reference images not displayed]

FINDINGS: There is a full thickness supraspinatus tendon tear.  There is moderate subscapularis tendinopathy.  Teres minor and infraspinatus muscles and tendons are within normal limits in morphology and signal intensity.  Long head of the biceps tendon is well seated within the intertubercular groove and attaches normally to the biceps anchor.  Superior labrum appears intact.  Glenohumeral articular cartilage is well maintained.  There is moderate acromioclavicular joint osteoarthritis.  Small amount of fluid is also noted within the subacromial/subdeltoid bursa.  Muscle bulk and bone marrow signal intensity are normal.  No mass is seen along the course of the suprascapular nerve, within the spinoglenoid notch or within the quadrilateral space.
IMPRESSION: 1. Full thickness supraspinatus tendon tear.  

2. Moderate acromioclavicular joint osteoarthritis.  

3. Moderate subscapularis tendinopathy.

## 2022-07-06 ENCOUNTER — Emergency Department (EMERGENCY_DEPARTMENT_HOSPITAL): Payer: Medicare Other

## 2022-07-06 ENCOUNTER — Encounter (HOSPITAL_COMMUNITY): Payer: Self-pay | Admitting: Family

## 2022-07-06 ENCOUNTER — Other Ambulatory Visit: Payer: Self-pay

## 2022-07-06 ENCOUNTER — Emergency Department
Admission: EM | Admit: 2022-07-06 | Discharge: 2022-07-06 | Disposition: A | Discharge status: Home / Self Care | Payer: Medicare Other | Attending: Family | Admitting: Family

## 2022-07-06 DIAGNOSIS — Z20822 Contact with and (suspected) exposure to covid-19: Secondary | ICD-10-CM

## 2022-07-06 DIAGNOSIS — B09 Unspecified viral infection characterized by skin and mucous membrane lesions: Secondary | ICD-10-CM

## 2022-07-06 DIAGNOSIS — R0989 Other specified symptoms and signs involving the circulatory and respiratory systems: Secondary | ICD-10-CM

## 2022-07-06 LAB — COMPREHENSIVE METABOLIC PANEL, NON-FASTING
ALBUMIN/GLOBULIN RATIO: 1.3 (ref 0.8–1.4)
ALBUMIN: 4 g/dL (ref 3.5–5.7)
ALKALINE PHOSPHATASE: 33 U/L — ABNORMAL LOW (ref 34–104)
ALT (SGPT): 18 U/L (ref 7–52)
ANION GAP: 11 mmol/L (ref 10–20)
AST (SGOT): 19 U/L (ref 13–39)
BILIRUBIN TOTAL: 0.7 mg/dL (ref 0.3–1.2)
BUN/CREA RATIO: 20 (ref 6–22)
BUN: 18 mg/dL (ref 7–25)
CALCIUM, CORRECTED: 8.8 mg/dL — ABNORMAL LOW (ref 8.9–10.8)
CALCIUM: 8.8 mg/dL (ref 8.6–10.3)
CHLORIDE: 97 mmol/L — ABNORMAL LOW (ref 98–107)
CO2 TOTAL: 25 mmol/L (ref 21–31)
CREATININE: 0.88 mg/dL (ref 0.60–1.30)
ESTIMATED GFR: 95 mL/min/{1.73_m2} (ref >59)
GLOBULIN: 3.1 (ref 2.9–5.4)
GLUCOSE: 188 mg/dL — ABNORMAL HIGH (ref 74–109)
OSMOLALITY, CALCULATED: 273 mOsm/kg (ref 270–290)
POTASSIUM: 3.3 mmol/L — ABNORMAL LOW (ref 3.5–5.1)
PROTEIN TOTAL: 7.1 g/dL (ref 6.4–8.9)
SODIUM: 133 mmol/L — ABNORMAL LOW (ref 136–145)

## 2022-07-06 LAB — LACTIC ACID LEVEL W/ REFLEX FOR LEVEL >2.0: LACTIC ACID: 1.6 mmol/L (ref 0.5–2.2)

## 2022-07-06 LAB — URINALYSIS, MACROSCOPIC
BILIRUBIN: NEGATIVE mg/dL
BLOOD: NEGATIVE mg/dL
GLUCOSE: NEGATIVE mg/dL
KETONES: NEGATIVE mg/dL
LEUKOCYTES: NEGATIVE WBCs/uL
NITRITE: NEGATIVE
PH: 5.5 (ref 5.0–9.0)
PROTEIN: 20 mg/dL
SPECIFIC GRAVITY: 1.018 (ref 1.002–1.030)
UROBILINOGEN: NORMAL mg/dL

## 2022-07-06 LAB — CBC WITH DIFF
BASOPHIL #: 0 10*3/uL (ref 0.00–0.30)
BASOPHIL %: 1 % (ref 0–3)
EOSINOPHIL #: 0 10*3/uL (ref 0.00–0.80)
EOSINOPHIL %: 1 % (ref 0–7)
HCT: 33.7 % — ABNORMAL LOW (ref 42.0–51.0)
HGB: 11.5 g/dL — ABNORMAL LOW (ref 13.5–18.0)
LYMPHOCYTE #: 1.3 10*3/uL (ref 1.10–5.00)
LYMPHOCYTE %: 20 % — ABNORMAL LOW (ref 25–45)
MCH: 31 pg (ref 27.0–32.0)
MCHC: 34.1 g/dL (ref 32.0–36.0)
MCV: 91 fL (ref 78.0–99.0)
MONOCYTE #: 0.7 10*3/uL (ref 0.00–1.30)
MONOCYTE %: 12 % (ref 0–12)
MPV: 8.7 fL (ref 7.4–10.4)
NEUTROPHIL #: 4.2 10*3/uL (ref 1.80–8.40)
NEUTROPHIL %: 67 % (ref 40–76)
PLATELETS: 247 10*3/uL (ref 140–440)
RBC: 3.7 10*6/uL — ABNORMAL LOW (ref 4.20–6.00)
RDW: 14.7 % (ref 11.6–14.8)
WBC: 6.3 10*3/uL (ref 4.0–10.5)
WBCS UNCORRECTED: 6.3 10*3/uL

## 2022-07-06 LAB — GOLD TOP TUBE

## 2022-07-06 LAB — PT/INR
INR: 1.81 (ref <=5.00)
PROTHROMBIN TIME: 20.9 seconds — ABNORMAL HIGH (ref 9.8–12.7)

## 2022-07-06 LAB — PTT (PARTIAL THROMBOPLASTIN TIME): APTT: 47.7 seconds — ABNORMAL HIGH (ref 26.0–36.0)

## 2022-07-06 LAB — COVID-19, FLU A/B, RSV RAPID BY PCR
INFLUENZA VIRUS TYPE A: NOT DETECTED
INFLUENZA VIRUS TYPE B: NOT DETECTED
RESPIRATORY SYNCTIAL VIRUS (RSV): NOT DETECTED
SARS-CoV-2: NOT DETECTED

## 2022-07-06 LAB — URINALYSIS, MICROSCOPIC
HYALINE CASTS: 1 /lpf — ABNORMAL HIGH (ref <0)
RBCS: 1 /hpf (ref <4)
SQUAMOUS EPITHELIAL: <1 /hpf (ref <28)
WBCS: 1 /hpf (ref <6)

## 2022-07-06 LAB — CREATINE KINASE (CK), TOTAL, SERUM: CREATINE KINASE: 121 U/L (ref 30–223)

## 2022-07-06 LAB — C-REACTIVE PROTEIN(CRP),INFLAMMATION: C-REACTIVE PROTEIN (CRP): 13 mg/dL — ABNORMAL HIGH (ref 0.1–0.5)

## 2022-07-06 LAB — MAGNESIUM: MAGNESIUM: 1.9 mg/dL (ref 1.9–2.7)

## 2022-07-06 MED ORDER — PREDNISONE 50 MG TABLET
50.0000 mg | ORAL_TABLET | Freq: Every day | ORAL | 0 refills | Status: AC
Start: 2022-07-06 — End: 2022-07-11

## 2022-07-06 MED ORDER — DEXAMETHASONE SODIUM PHOSPHATE (PF) 10 MG/ML INJECTION SOLUTION
10.0000 mg | INTRAMUSCULAR | Status: AC
Start: 2022-07-06 — End: 2022-07-06
  Administered 2022-07-06: 10 mg via INTRAMUSCULAR

## 2022-07-06 MED ORDER — DEXAMETHASONE SODIUM PHOSPHATE (PF) 10 MG/ML INJECTION SOLUTION
INTRAMUSCULAR | Status: AC
Start: 2022-07-06 — End: 2022-07-06
  Filled 2022-07-06: qty 1

## 2022-07-06 NOTE — ED Triage Notes (Signed)
Congestion and rash since July 27th; seen PCP on the 28th, given steroid, keflex, and cream w/o relief; hypotension 90/60's at home; reports subjective fever with chills and bilateral lower extremity edema     Immunosuppression for RA     Ibuprofen @1100 

## 2022-07-06 NOTE — ED Provider Notes (Signed)
[facility]  ED Primary Provider Note  History of Present Illness   Chief Complaint   Patient presents with    Congestion    Rash     Andrew Haney is a 66 y.o. male who had concerns including Congestion and Rash.  Arrival: The patient arrived by Car    Patient 66 year old male presents emergency department complaining of congestion, body aches, fatigue, diffuse rash since July 27th.  Patient saw his primary care provider and was tested for COVID, flu, RSV and placed on a 2 day course of steroids, Keflex and triamcinolone cream for diffuse rash to arms, legs and abdomen.  Patient since has continued to have myalgias, sore throat and rash without benefit from triamcinolone cream and 2 day burst of steroids.  Patient denies fever at home that he is taken but does endorse chills and myalgias.  Patient denies hypotension, severe pain, or new medication.  Patient is on immunosuppressant medication for RA.    Review of Systems   Pertinent positive and negative ROS as per HPI.  Historical Data   History Reviewed This Encounter: Medical History  Surgical History  Family History  Social History    Physical Exam   ED Triage Vitals [07/06/22 1616]   BP (Non-Invasive) 101/66   Heart Rate 79   Respiratory Rate 20   Temperature 36.2 C (97.2 F)   SpO2 95 %   Weight 127 kg (280 lb)   Height 1.803 m (_0 )     Physical Exam  Vitals and nursing note reviewed.   Constitutional:       General: He is not in acute distress.     Appearance: He is well-developed.   HENT:      Head: Normocephalic and atraumatic.   Eyes:      Conjunctiva/sclera: Conjunctivae normal.   Cardiovascular:      Rate and Rhythm: Normal rate and regular rhythm.      Heart sounds: No murmur heard.  Pulmonary:      Effort: Pulmonary effort is normal. No respiratory distress.      Breath sounds: Normal breath sounds.   Abdominal:      Palpations: Abdomen is soft.      Tenderness: There is no abdominal tenderness.    Musculoskeletal:         General: No swelling.      Cervical back: Neck supple.   Skin:     General: Skin is warm and dry.      Capillary Refill: Capillary refill takes less than 2 seconds.      Findings: Rash present. Rash is macular.      Comments: Macular rash noted to arms legs and abdomen   Neurological:      Mental Status: He is alert.   Psychiatric:         Mood and Affect: Mood normal.     Patient Data     Labs Ordered/Reviewed   C-REACTIVE PROTEIN(CRP),INFLAMMATION - Abnormal; Notable for the following components:       Result Value    C-REACTIVE PROTEIN (CRP) 13.0 (*)     All other components within normal limits   COMPREHENSIVE METABOLIC PANEL, NON-FASTING - Abnormal; Notable for the following components:    SODIUM 133 (*)     POTASSIUM 3.3 (*)     CHLORIDE 97 (*)     GLUCOSE 188 (*)     ALKALINE PHOSPHATASE 33 (*)     CALCIUM, CORRECTED 8.8 (*)  All other components within normal limits    Narrative:     Estimated Glomerular Filtration Rate (eGFR) is calculated using the CKD-EPI (2021) equation, intended for patients 62 years of age and older. If gender is not documented or "unknown", there will be no eGFR calculation.   PT/INR - Abnormal; Notable for the following components:    PROTHROMBIN TIME 20.9 (*)     All other components within normal limits    Narrative:     INR OF 2.0-3.0  RECOMMENDED FOR: PROPHYLAXIS/TREATMENT OF VENEOUS THROMBOSIS, PULMONARY EMBOLISM, PREVENTION OF SYSTEMIC EMBOLISM FROM ATRIAL FIBRILATION, MYOCARDIAL INFARCTION.    INR OF 2.5-3.5  RECOMMENDED FOR MECHANICAL PROSTHETIC HEART VALVES, RECURRENT SYSTEMIC EMBOLISM, RECURRENT MYOCARDIAL INFARCTION.     PTT (PARTIAL THROMBOPLASTIN TIME) - Abnormal; Notable for the following components:    APTT 47.7 (*)     All other components within normal limits   CBC WITH DIFF - Abnormal; Notable for the following components:    RBC 3.70 (*)     HGB 11.5 (*)     HCT 33.7 (*)     LYMPHOCYTE % 20 (*)     All other components within normal  limits   URINALYSIS, MICROSCOPIC - Abnormal; Notable for the following components:    MUCOUS Rare (*)     HYALINE CASTS 1 (*)     All other components within normal limits   LACTIC ACID LEVEL W/ REFLEX FOR LEVEL >2.0 - Normal   MAGNESIUM - Normal   URINALYSIS, MACROSCOPIC - Normal   COVID-19, FLU A/B, RSV RAPID BY PCR - Normal    Narrative:     Results are for the simultaneous qualitative identification of SARS-CoV-2 (formerly 2019-nCoV), Influenza A, Influenza B, and RSV RNA. These etiologic agents are generally detectable in nasopharyngeal and nasal swabs during the ACUTE PHASE of infection. Hence, this test is intended to be performed on respiratory specimens collected from individuals with signs and symptoms of upper respiratory tract infection who meet Centers for Disease Control and Prevention (CDC) clinical and/or epidemiological criteria for Coronavirus Disease 2019 (COVID-19) testing. CDC COVID-19 criteria for testing on human specimens is available at [facility] webpage information for Healthcare Professionals: Coronavirus Disease 2019 (COVID-19) ([email or web address]).     False-negative results may occur if the virus has genomic mutations, insertions, deletions, or rearrangements or if performed very early in the course of illness. Otherwise, negative results indicate virus specific RNA targets are not detected, however negative results do not preclude SARS-CoV-2 infection/COVID-19, Influenza, or Respiratory syncytial virus infection. Results should not be used as the sole basis for patient management decisions. Negative results must be combined with clinical observations, patient history, and epidemiological information. If upper respiratory tract infection is still suspected based on exposure history together with other clinical findings, re-testing should be considered.    Disclaimer:   This assay has been authorized by FDA under an Emergency Use Authorization for use in  laboratories certified under the Clinical Laboratory Improvement Amendments of 1988 (CLIA), 42 U.S.C. ([phone], to perform high complexity tests. The impacts of vaccines, antiviral therapeutics, antibiotics, chemotherapeutic or immunosuppressant drugs have not been evaluated.     Test methodology:   Cepheid Xpert Xpress SARS-CoV-2/Flu/RSV Assay real-time polymerase chain reaction (RT-PCR) test on the GeneXpert Dx and Xpert Xpress systems.   CREATINE KINASE (CK), TOTAL, SERUM - Normal   ADULT ROUTINE BLOOD CULTURE, SET OF 2 BOTTLES (BACTERIA AND YEAST)   ADULT ROUTINE BLOOD CULTURE, SET OF 2 BOTTLES (BACTERIA AND YEAST)  CBC/DIFF    Narrative:     The following orders were created for panel order CBC/DIFF.  Procedure                               Abnormality         Status                     ---------                               -----------         ------                     CBC WITH MPNT[[id number]]                Abnormal            Final result                 Please view results for these tests on the individual orders.   URINALYSIS, MACROSCOPIC AND MICROSCOPIC W/CULTURE REFLEX    Narrative:     The following orders were created for panel order URINALYSIS, MACROSCOPIC AND MICROSCOPIC W/CULTURE REFLEX.  Procedure                               Abnormality         Status                     ---------                               -----------         ------                     URINALYSIS, MACROSCOPIC[[id number]]      Normal              Final result               URINALYSIS, MICROSCOPIC[[id number]]      Abnormal            Final result                 Please view results for these tests on the individual orders.   EXTRA TUBES    Narrative:     The following orders were created for panel order EXTRA TUBES.  Procedure                               Abnormality         Status                     ---------                               -----------         ------                     GOLD TOP GQQP[[id number]]  In process                   Please view results for these tests on the individual orders.   GOLD TOP TUBE     XR CHEST PA AND LATERAL   Final Result by Edi, Radresults In (08/03 1646)   NEGATIVE CHEST         Radiologist location ID: [place] Decision Making  66 year old male to emergency department with maculopapular rash to arms legs and abdomen.  Patient is afebrile on examination, normotensive with normal heart rate and SpO2.  White count shows no elevation in white count but does have elevation in CRP.  Patient has symptoms of sore throat and congestion as well.  Patient has unknown sick contacts at home and does endorse myalgias.  CK within normal limits.  Rashes not follow a single dermatome.  Patient denies any contact with poison ivy poison oak or sumac.  Patient denies any change in medications prior to rash.  Patient is currently not on any blood thinners.  Patient liver enzymes within normal limits at this time as well.  Patient has negative Nickolsky's sign.  Patient currently is denying neck pain, back pain blurred vision or change in consciousness.  Patient given 10 mg Decadron IM and placed on prednisone for rash.  Patient diagnosis of exclusion of viral exanthem due to sore throat, congestion and diffuse rash.  Patient follow-up closely with primary care provider for further evaluation of rash and come to the emergency department if worsening of rash or fever development.    Risk  Prescription drug management.             Medications Administered in the ED   dexamethasone (PF) 10 mg/mL injection (10 mg IntraMUSCULAR Given 07/06/22 1912)     Clinical Impression   Viral exanthem (Primary)       Disposition: Discharged

## 2022-07-11 LAB — ADULT ROUTINE BLOOD CULTURE, SET OF 2 BOTTLES (BACTERIA AND YEAST)
BLOOD CULTURE, ROUTINE: NO GROWTH
BLOOD CULTURE, ROUTINE: NO GROWTH

## 2022-07-17 ENCOUNTER — Other Ambulatory Visit: Payer: Self-pay
